# Patient Record
Sex: Male | Born: 1995
Health system: Southern US, Community
[De-identification: ages and names within clinical notes are randomized; demographics above are authoritative.]

## PROBLEM LIST (undated history)

## (undated) DIAGNOSIS — T7840XA Allergy, unspecified, initial encounter: Secondary | ICD-10-CM

## (undated) HISTORY — PX: MYRINGOTOMY: SUR874

## (undated) HISTORY — DX: Allergy, unspecified, initial encounter: T78.40XA

## (undated) HISTORY — PX: WISDOM TOOTH EXTRACTION: SHX21

---
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
## 2017-02-04 ENCOUNTER — Ambulatory Visit (INDEPENDENT_AMBULATORY_CARE_PROVIDER_SITE_OTHER): Payer: No Typology Code available for payment source | Admitting: Family Medicine

## 2017-02-04 ENCOUNTER — Encounter: Payer: Self-pay | Admitting: Family Medicine

## 2017-02-04 ENCOUNTER — Encounter (INDEPENDENT_AMBULATORY_CARE_PROVIDER_SITE_OTHER): Payer: Self-pay

## 2017-02-04 VITALS — BP 118/80 | HR 73 | Temp 97.6°F | Ht 69.25 in | Wt 230.5 lb

## 2017-02-04 DIAGNOSIS — Z7689 Persons encountering health services in other specified circumstances: Secondary | ICD-10-CM

## 2017-02-04 DIAGNOSIS — E6609 Other obesity due to excess calories: Secondary | ICD-10-CM | POA: Diagnosis not present

## 2017-02-04 DIAGNOSIS — L659 Nonscarring hair loss, unspecified: Secondary | ICD-10-CM | POA: Diagnosis not present

## 2017-02-04 DIAGNOSIS — H6123 Impacted cerumen, bilateral: Secondary | ICD-10-CM | POA: Diagnosis not present

## 2017-02-04 DIAGNOSIS — Z6833 Body mass index (BMI) 33.0-33.9, adult: Secondary | ICD-10-CM

## 2017-02-04 NOTE — Progress Notes (Signed)
Subjective:    Patient ID: Todd Noble, male    DOB: 1995-08-07, 22 y.o.   MRN: [id number]  HPI This is a 22 yo male who presents today to establish care. He lives at home with his parents and two younger brothers, works at [employer] full time and goes to [employer] at [place]. Wants to transfer to [employer] to studying acting. Likes to watch movies, hang out with family and friends.   Feels like he is not hearing as well as he should. Had tubes in both ears as child. Most of time he hears fine, just occasionally feels like he has to turn up television volume. No trouble hearing in class. Uses cotton swabs in ears after showering. Uses ear buds and listens to loud music.   Hair thinning on top. Noticed for several years since puberty. Father and maternal grandfather with thin hair.   Never been sexually active. Does not drink, use tobacco products or do drugs.   Sleeps about 7 hours per night. Feels rested. Stress manageable, better than when he was in high school. No regular exercise, is active at work and walks around campus. Trying to eat healthier. Has decreased soda intake.    Past Medical History:  Diagnosis Date  . Abdominal pain   . Allergy    Past Surgical History:  Procedure Laterality Date  . EAR CANALOPLASTY     Family History  Problem Relation Age of Onset  . Ulcers Paternal Grandmother   . Arthritis Maternal Grandmother   . Asthma Maternal Grandmother   . COPD Maternal Grandmother   . Depression Maternal Grandmother   . Diabetes Maternal Grandmother   . Hyperlipidemia Maternal Grandmother   . Hypertension Maternal Grandmother   . COPD Paternal Grandfather   . Hearing loss Paternal Grandfather    Social History   Tobacco Use  . Smoking status: Never Smoker  . Smokeless tobacco: Never Used  Substance Use Topics  . Alcohol use: No    Frequency: Never  . Drug use: No      Review of Systems  Constitutional: Positive for unexpected weight change (weight gain with  working at [employer]). Negative for fatigue and fever.  HENT: Positive for hearing loss. Negative for congestion, dental problem, ear pain and rhinorrhea.   Eyes: Negative.   Respiratory: Negative for cough and shortness of breath.   Cardiovascular: Negative for chest pain.  Gastrointestinal: Negative for abdominal pain, constipation and diarrhea.  Genitourinary: Negative for dysuria.  Musculoskeletal: Negative.   Skin:       Thinning hair  Neurological: Negative for headaches.  Psychiatric/Behavioral: Negative for dysphoric mood and sleep disturbance. The patient is not nervous/anxious.        Objective:   Physical Exam  Constitutional: He is oriented to person, place, and time. He appears well-developed and well-nourished. No distress.  Obese.   HENT:  Head: Normocephalic and atraumatic.  Right Ear: External ear normal.  Left Ear: External ear normal.  Mouth/Throat: Oropharynx is clear and moist.  Moderate amount cerumen bilaterally. No tenderness.   Eyes: Conjunctivae are normal.  Neck: Normal range of motion. Neck supple.  Cardiovascular: Normal rate, regular rhythm and normal heart sounds.  Pulmonary/Chest: Effort normal and breath sounds normal.  Lymphadenopathy:    He has no cervical adenopathy.  Neurological: He is alert and oriented to person, place, and time.  Skin: Skin is warm and dry. He is not diaphoretic.  Psychiatric: He has a normal mood and affect.  His behavior is normal. Judgment and thought content normal.  Vitals reviewed.     BP 118/80   Pulse 73   Temp 97.6 F (36.4 C) (Oral)   Ht 5' 9.25" (1.759 m)   Wt 230 lb 8 oz (104.6 kg)   SpO2 97%   BMI 33.79 kg/m   Hearing test- nml at 500, 1000, 2000, 4000    Assessment & Plan:  1. Encounter to establish care - Discussed and encouraged healthy lifestyle choices- adequate sleep, regular exercise, stress management and healthy food choices.  - will get his immunization record  -  anticipatory guidance regarding sex, alcohol, drugs, driving, STE - follow up in 1 year  2. Bilateral impacted cerumen - provided information regarding ear care and suggestions for softening cerumen  3. Class 1 obesity due to excess calories without serious comorbidity with body mass index (BMI) of 33.0 to 33.9 in adult - has been working on weight loss, encouraged him to further decrease soda intake, increase vegetable/ lean proteins   4. Thinning hair - sounds familial, can try otc minoxidil containing products   Todd Reeeborah Gianella Chismar, FNP-BC  [facility], [facility]  02/04/2017 10:17 AM

## 2017-02-04 NOTE — Patient Instructions (Signed)
Good to see you today  Please come back in one year for your annual visit  For thinning hair, can try Minoxinil containing shampoos  Your hearing is good, you do have quite a bit of wax in your ears  Can use over the counter wax softening drops or place a drop or two of mineral oil in your ears once or twice a week   Earwax Buildup, Adult The ears produce a substance called earwax that helps keep bacteria out of the ear and protects the skin in the ear canal. Occasionally, earwax can build up in the ear and cause discomfort or hearing loss. What increases the risk? This condition is more likely to develop in people who:  Are male.  Are elderly.  Naturally produce more earwax.  Clean their ears often with cotton swabs.  Use earplugs often.  Use in-ear headphones often.  Wear hearing aids.  Have narrow ear canals.  Have earwax that is overly thick or sticky.  Have eczema.  Are dehydrated.  Have excess hair in the ear canal.  What are the signs or symptoms? Symptoms of this condition include:  Reduced or muffled hearing.  A feeling of fullness in the ear or feeling that the ear is plugged.  Fluid coming from the ear.  Ear pain.  Ear itch.  Ringing in the ear.  Coughing.  An obvious piece of earwax that can be seen inside the ear canal.  How is this diagnosed? This condition may be diagnosed based on:  Your symptoms.  Your medical history.  An ear exam. During the exam, your health care provider will look into your ear with an instrument called an otoscope.  You may have tests, including a hearing test. How is this treated? This condition may be treated by:  Using ear drops to soften the earwax.  Having the earwax removed by a health care provider. The health care provider may: ? Flush the ear with water. ? Use an instrument that has a loop on the end (curette). ? Use a suction device.  Surgery to remove the wax buildup. This may be done in  severe cases.  Follow these instructions at home:  Take over-the-counter and prescription medicines only as told by your health care provider.  Do not put any objects, including cotton swabs, into your ear. You can clean the opening of your ear canal with a washcloth or facial tissue.  Follow instructions from your health care provider about cleaning your ears. Do not over-clean your ears.  Drink enough fluid to keep your urine clear or pale yellow. This will help to thin the earwax.  Keep all follow-up visits as told by your health care provider. If earwax builds up in your ears often or if you use hearing aids, consider seeing your health care provider for routine, preventive ear cleanings. Ask your health care provider how often you should schedule your cleanings.  If you have hearing aids, clean them according to instructions from the manufacturer and your health care provider. Contact a health care provider if:  You have ear pain.  You develop a fever.  You have blood, pus, or other fluid coming from your ear.  You have hearing loss.  You have ringing in your ears that does not go away.  Your symptoms do not improve with treatment.  You feel like the room is spinning (vertigo). Summary  Earwax can build up in the ear and cause discomfort or hearing loss.  The most  common symptoms of this condition include reduced or muffled hearing and a feeling of fullness in the ear or feeling that the ear is plugged.  This condition may be diagnosed based on your symptoms, your medical history, and an ear exam.  This condition may be treated by using ear drops to soften the earwax or by having the earwax removed by a health care provider.  Do not put any objects, including cotton swabs, into your ear. You can clean the opening of your ear canal with a washcloth or facial tissue. This information is not intended to replace advice given to you by your health care provider. Make sure you  discuss any questions you have with your health care provider. Document Released: 02/02/2004 Document Revised: 03/07/2016 Document Reviewed: 03/07/2016 Elsevier Interactive Patient Education  [employer]2018 [employer]

## 2018-04-30 ENCOUNTER — Telehealth: Payer: No Typology Code available for payment source | Admitting: Family

## 2018-04-30 DIAGNOSIS — W57XXXA Bitten or stung by nonvenomous insect and other nonvenomous arthropods, initial encounter: Secondary | ICD-10-CM | POA: Diagnosis not present

## 2018-04-30 DIAGNOSIS — S20369A Insect bite (nonvenomous) of unspecified front wall of thorax, initial encounter: Secondary | ICD-10-CM

## 2018-04-30 MED ORDER — DOXYCYCLINE HYCLATE 100 MG PO TABS: 100.0000 mg | ORAL_TABLET | Freq: Two times a day (BID) | ORAL | 0 refills | Status: AC

## 2018-04-30 NOTE — Progress Notes (Signed)
Thank you for describing your tick bite, Here is how we plan to help! Based on the information that you shared with me it looks like you have A tick that bite that we will treat with a short course of doxycycline.    Approximately 5 minutes was spent documenting and reviewing patient's chart.    In most cases a tick bite is painless and does not itch.  Most tick bites in which the tick is quickly removed do not require prescriptions. Ticks can transmit several diseases if they are infected and remain attacked to your skin. Therefore the length that the tick was attached and any symptoms you have experienced after the bite are import to accurately develop your custom treatment plan. In most cases a single dose of doxycycline may prevent the development of a more serious condition.  Based on your information I have Provided a home care guide for tick bites  and  instructions on when to call for help. and Your symptoms indicate that you need a longer course of antibiotics and a follow up visit with a provider. I have sent doxycycline 100 mg twice a day for 21 days to the pharmacy that you selected. You will need to schedule a follow up visit with your provider. If you do not have a primary care provider you may use our telehealth physicians on the web at [email or web address].  Which ticks  are associated with illness?  The Wood Tick (dog tick) is the size of a watermelon seed and can sometimes transmit Rocky Mountain spotted fever and [place] tick fever.   The Deer Tick (black-legged tick) is between the size of a poppy seed (pin head) and an apple seed, and can sometimes transmit Lyme disease.  A brown to black tick with a white splotch on its back is likely a male Amblyomma americanum ([place] tick). This tick has been associated with Southern Tick Associated illness ( STARI)  Lyme disease has become the most common tick-borne illness in the [place]. The risk of Lyme disease following a  recognized deer tick bite is estimated to be 1%.  The majority of cases of Lyme disease start with a bull's eye rash at the site of the tick bite. The rash can occur days to weeks (typically 7-10 days) after a tick bite. Treatment with antibiotics is indicated if this rash appears. Flu-like symptoms may accompany the rash, including: fever, chills, headaches, muscle aches, and fatigue. Removing ticks promptly may prevent tick borne disease.  What can be used to prevent Tick Bites?   Insect repellant with at leas 20% DEET.  Wearing long pants with sock and shoes.  Avoiding tall grass and heavily wooded areas.  Checking your skin after being outdoors.  Shower with a washcloth after outdoor exposures.  HOME CARE ADVICE FOR TICK BITE  1. Wood Tick Removal:  o Use a pair of tweezers and grasp the wood tick close to the skin (on its head). Pull the wood tick straight upward without twisting or crushing it. Maintain a steady pressure until it releases its grip.   o If tweezers aren't available, use fingers, a loop of thread around the jaws, or a needle between the jaws for traction.  o Note: covering the tick with petroleum jelly, nail polish or rubbing alcohol doesn't work. Neither does touching the tick with a hot or cold object. 2. Tiny Deer Tick Removal:   o Needs to be scraped off with a knife blade or credit card   edge. o Place tick in a sealed container (e.g. glass jar, zip lock plastic bag), in case your doctor wants to see it. 3. Tick's Head Removal:  o If the wood tick's head breaks off in the skin, it must be removed. Clean the skin. Then use a sterile needle to uncover the head and lift it out or scrape it off.  o If a very small piece of the head remains, the skin will eventually slough it off. 4. Antibiotic Ointment:  o Wash the wound and your hands with soap and water after removal to prevent catching any tick disease.  Apply an over the counter antibiotic ointment (e.g.  bacitracin) to the bite once. 5. Expected Course: Tick bites normally don't itch or hurt. That's why they often go unnoticed. 6. Call Your Doctor If:  o You can't remove the tick or the tick's head o Fever, a severe head ache, or rash occur in the next 2 weeks o Bite begins to look infected o Lyme's disease is common in your area o You have not had a tetanus in the last 10 years o Your current symptoms become worse    MAKE SURE YOU   Understand these instructions.  Will watch your condition.  Will get help right away if you are not doing well or get worse.   Thank you for choosing an e-visit.  Your e-visit answers were reviewed by a board certified advanced clinical practitioner to complete your personal care plan. Depending upon the condition, your plan could have included both over the counter or prescription medications. Please review your pharmacy choice. If there is a problem you may use MyChart messaging to have the prescription routed to another pharmacy. Your safety is important to us. If you have drug allergies check your prescription carefully.   You can use MyChart to ask questions about today's visit, request a non-urgent call back, or ask for a work or school excuse for 24 hours related to this e-Visit. If it has been greater than 24 hours you will need to follow up with your provider, or enter a new e-Visit to address those concerns.  You will get an email in the next two days asking about your experience. I hope  that your e-visit has been valuable and will speed your recovery  

## 2019-03-03 ENCOUNTER — Encounter: Payer: Self-pay | Admitting: Family Medicine

## 2019-03-03 NOTE — Telephone Encounter (Signed)
Pt called to schedule acute appointment for his stomach issues.  I scheduled for 03/06/19  Is it ok for pt to come in office.  He stated he only gets diarrhea after he eats certain foods.  Neg for call other covid symptoms

## 2019-03-03 NOTE — Telephone Encounter (Signed)
Pt aware he can come in office per debbie

## 2019-03-03 NOTE — Telephone Encounter (Signed)
That is fine 

## 2019-03-06 ENCOUNTER — Other Ambulatory Visit: Payer: Self-pay

## 2019-03-06 ENCOUNTER — Ambulatory Visit (INDEPENDENT_AMBULATORY_CARE_PROVIDER_SITE_OTHER): Payer: No Typology Code available for payment source | Admitting: Family Medicine

## 2019-03-06 VITALS — BP 110/74 | HR 83 | Temp 96.7°F | Ht 69.25 in | Wt 234.5 lb

## 2019-03-06 DIAGNOSIS — R197 Diarrhea, unspecified: Secondary | ICD-10-CM

## 2019-03-06 DIAGNOSIS — E6609 Other obesity due to excess calories: Secondary | ICD-10-CM | POA: Diagnosis not present

## 2019-03-06 DIAGNOSIS — Z6834 Body mass index (BMI) 34.0-34.9, adult: Secondary | ICD-10-CM

## 2019-03-06 NOTE — Patient Instructions (Signed)
Good to see you today  Let me know if diarrhea returns- can call or send a mychart message  Continue to reduce soda/ sweet tea/ juice consumption  Try to squeeze in a walk most nights

## 2019-03-06 NOTE — Progress Notes (Signed)
Subjective:    Patient ID: Todd Noble, male    DOB: 14-Feb-1995, 24 y.o.   MRN: [id number]  HPI Chief Complaint  Patient presents with  . Diarrhea    x 3 weeks. Off and on. In the mornings.   This is a 24 yo male who presents today with above cc.   Awoke about 3 weeks ago with some urgency of bowels, thought he had eaten something that didn't agree with him. Diarrhea has been coming and going. Seems worse after dinner some nights, but no consistent pattern. No diarrhea this week. Prior had 2/x week. Stool was watery at first, then would get more solid.  Denies blood or mucus. Little abdominal pain/ cramping which resolved with BM. No nausea/ vomiting. Drinking soda, water, milk, juice. No one else at home has been sick. History of IBS as a child. He denies any significant change in stressors.  He continues to work at [employer] and go to [employer].  He lives at home.   Review of Systems Per HPI    Objective:   Physical Exam Vitals reviewed.  Constitutional:      General: He is not in acute distress.    Appearance: Normal appearance. He is obese. He is not ill-appearing, toxic-appearing or diaphoretic.  HENT:     Head: Normocephalic and atraumatic.  Eyes:     Conjunctiva/sclera: Conjunctivae normal.  Cardiovascular:     Rate and Rhythm: Normal rate and regular rhythm.     Heart sounds: Normal heart sounds.  Pulmonary:     Effort: Pulmonary effort is normal.     Breath sounds: Normal breath sounds.  Abdominal:     General: Bowel sounds are normal. There is no distension.     Palpations: Abdomen is soft. There is no mass.     Tenderness: There is no abdominal tenderness. There is no guarding or rebound.     Hernia: No hernia is present.  Skin:    General: Skin is warm and dry.  Neurological:     Mental Status: He is alert and oriented to person, place, and time.  Psychiatric:        Mood and Affect: Mood normal.        Behavior: Behavior normal.        Thought Content:  Thought content normal.        Judgment: Judgment normal.       BP 110/74   Pulse 83   Temp (!) 96.7 F (35.9 C)   Ht 5' 9.25" (1.759 m) Comment: need to update on next visit  Wt 234 lb 8 oz (106.4 kg)   SpO2 97%   BMI 34.38 kg/m  Wt Readings from Last 3 Encounters:  03/06/19 234 lb 8 oz (106.4 kg)  02/04/17 230 lb 8 oz (104.6 kg)  10/23/10 153 lb (69.4 kg) (93 %, Z= 1.48)*   * Growth percentiles are based on CDC (Boys, 2-20 Years) data.       Assessment & Plan:  1. Diarrhea, unspecified type -Clear etiology, seems to have resolved as he has had no episodes this week. -Discussed importance of good hydration and can use small amounts of Imodium if recurs. -Follow-up if increased frequency, severity, fever/chills, difficulty maintaining hydration  2. Class 1 obesity due to excess calories without serious comorbidity with body mass index (BMI) of 34.0 to 34.9 in adult -Discussed his current diet and barriers to healthy food choices  -Follow-up as needed  This visit occurred  during the SARS-CoV-2 public health emergency.  Safety protocols were in place, including screening questions prior to the visit, additional usage of staff PPE, and extensive cleaning of exam room while observing appropriate contact time as indicated for disinfecting solutions.      Olean Ree, FNP-BC  [facility], [facility]  03/07/2019 12:20 PM

## 2019-03-07 ENCOUNTER — Encounter: Payer: Self-pay | Admitting: Family Medicine

## 2022-10-25 ENCOUNTER — Ambulatory Visit
Admission: EM | Admit: 2022-10-25 | Discharge: 2022-10-25 | Disposition: A | Discharge status: Home / Self Care | Payer: 59 | Attending: Family Medicine | Admitting: Family Medicine

## 2022-10-25 ENCOUNTER — Other Ambulatory Visit: Payer: Self-pay

## 2022-10-25 ENCOUNTER — Encounter: Payer: Self-pay | Admitting: Emergency Medicine

## 2022-10-25 DIAGNOSIS — J069 Acute upper respiratory infection, unspecified: Secondary | ICD-10-CM | POA: Insufficient documentation

## 2022-10-25 DIAGNOSIS — Z1152 Encounter for screening for COVID-19: Secondary | ICD-10-CM | POA: Insufficient documentation

## 2022-10-25 LAB — POCT INFLUENZA A/B
Influenza A, POC: NEGATIVE
Influenza B, POC: NEGATIVE

## 2022-10-25 NOTE — Discharge Instructions (Signed)
You were seen today for upper respiratory symptoms.  Your flu swab was negative.  The covid swab will be resulted tomorrow and you will be notified if positive.  You may also see these results in mychart.  I have given you a note for work.  Please get plenty of rest and fluids.  You may continue over the counter medications.  You may return to work when fever free and feeling improved x 24 hrs.

## 2022-10-25 NOTE — ED Provider Notes (Signed)
[facility]    CSN: [id number] Arrival date & time: 10/25/22  0855      History   Chief Complaint Chief Complaint  Patient presents with   Cough    HPI Todd Noble is a 27 y.o. male.    Cough Associated symptoms: fever   Yesterday he started with cough and fever.  He felt weak.  He had to call out to work today  He did not check his temp, but felt hot.  Also with headache.  Runny nose, congestion, drainage.    No wheezing or sob.  No sick contacts.  He did take mucinex, nyquil, dayquil, motrin.  No home covid testing.          Past Medical History:  Diagnosis Date   Abdominal pain    Allergy     Patient Active Problem List   Diagnosis Date Noted   Class 1 obesity due to excess calories without serious comorbidity with body mass index (BMI) of 33.0 to 33.9 in adult 02/04/2017   Diarrhea 07/10/2010   Generalized abdominal pain     Past Surgical History:  Procedure Laterality Date   MYRINGOTOMY         Home Medications    Prior to Admission medications   Medication Sig Start Date End Date Taking? Authorizing Provider  Fiber CHEW Chew 1 tablet by mouth daily. 07/10/10   Jon Gills, MD    Family History Family History  Problem Relation Age of Onset   Ulcers Paternal Grandmother    Arthritis Maternal Grandmother    Asthma Maternal Grandmother    COPD Maternal Grandmother    Depression Maternal Grandmother    Diabetes Maternal Grandmother    Hyperlipidemia Maternal Grandmother    Hypertension Maternal Grandmother    COPD Paternal Grandfather    Hearing loss Paternal Grandfather     Social History Social History   Tobacco Use   Smoking status: Never   Smokeless tobacco: Never  Vaping Use   Vaping status: Never Used  Substance Use Topics   Alcohol use: No   Drug use: No     Allergies   Shellfish allergy and Sulfa antibiotics   Review of Systems Review of Systems  Constitutional:  Positive for fever.  HENT:   Positive for congestion.   Respiratory:  Positive for cough.   Cardiovascular: Negative.   Gastrointestinal: Negative.   Musculoskeletal: Negative.      Physical Exam Triage Vital Signs ED Triage Vitals  Encounter Vitals Group     BP 10/25/22 0917 121/78     Systolic BP Percentile --      Diastolic BP Percentile --      Pulse Rate 10/25/22 0917 94     Resp 10/25/22 0917 18     Temp 10/25/22 0917 97.9 F (36.6 C)     Temp Source 10/25/22 0917 Oral     SpO2 10/25/22 0917 94 %     Weight 10/25/22 0919 231 lb (104.8 kg)     Height 10/25/22 0919 5\' 10"  (1.778 m)     Head Circumference --      Peak Flow --      Pain Score 10/25/22 0919 6     Pain Loc --      Pain Education --      Exclude from Growth Chart --    No data found.  Updated Vital Signs BP 121/78 (BP Location: Left Arm)   Pulse 94   Temp 97.9  F (36.6 C) (Oral)   Resp 18   Ht 5\' 10"  (1.778 m)   Wt 104.8 kg   SpO2 94%   BMI 33.15 kg/m   Visual Acuity Right Eye Distance:   Left Eye Distance:   Bilateral Distance:    Right Eye Near:   Left Eye Near:    Bilateral Near:     Physical Exam Constitutional:      General: He is not in acute distress.    Appearance: Normal appearance. He is not ill-appearing.  HENT:     Right Ear: Tympanic membrane normal.     Left Ear: Tympanic membrane normal.     Nose: Congestion present.     Mouth/Throat:     Mouth: Mucous membranes are moist.  Cardiovascular:     Rate and Rhythm: Normal rate and regular rhythm.  Pulmonary:     Effort: Pulmonary effort is normal.     Breath sounds: Normal breath sounds.  Musculoskeletal:     Cervical back: Normal range of motion and neck supple. No tenderness.  Neurological:     General: No focal deficit present.     Mental Status: He is alert.  Psychiatric:        Mood and Affect: Mood normal.      UC Treatments / Results  Labs (all labs ordered are listed, but only abnormal results are displayed) Labs Reviewed  SARS  CORONAVIRUS 2 (TAT 6-24 HRS)  POCT INFLUENZA A/B    EKG   Radiology No results found.  Procedures Procedures (including critical care time)  Medications Ordered in UC Medications - No data to display  Initial Impression / Assessment and Plan / UC Course  I have reviewed the triage vital signs and the nursing notes.  Pertinent labs & imaging results that were available during my care of the patient were reviewed by me and considered in my medical decision making (see chart for details).   Final Clinical Impressions(s) / UC Diagnoses   Final diagnoses:  Encounter for screening for COVID-19  Acute upper respiratory infection     Discharge Instructions      You were seen today for upper respiratory symptoms.  Your flu swab was negative.  The covid swab will be resulted tomorrow and you will be notified if positive.  You may also see these results in mychart.  I have given you a note for work.  Please get plenty of rest and fluids.  You may continue over the counter medications.  You may return to work when fever free and feeling improved x 24 hrs.     ED Prescriptions   None    PDMP not reviewed this encounter.   Jannifer Franklin, MD 10/25/22 [phone]

## 2022-10-25 NOTE — ED Triage Notes (Signed)
Patient c/o cough, fever, fatigue x 1 day.  Patient has taken Mucinex, Dayquil, Nyquil and Ibuprofen for sx's.

## 2022-10-26 LAB — SARS CORONAVIRUS 2 (TAT 6-24 HRS): SARS Coronavirus 2: NEGATIVE

## 2022-10-29 ENCOUNTER — Encounter: Payer: Self-pay | Admitting: Emergency Medicine

## 2022-10-29 ENCOUNTER — Ambulatory Visit: Payer: 59

## 2022-10-29 ENCOUNTER — Ambulatory Visit
Admission: EM | Admit: 2022-10-29 | Discharge: 2022-10-29 | Disposition: A | Discharge status: Home / Self Care | Payer: 59 | Attending: Physician Assistant | Admitting: Physician Assistant

## 2022-10-29 DIAGNOSIS — R059 Cough, unspecified: Secondary | ICD-10-CM

## 2022-10-29 DIAGNOSIS — J189 Pneumonia, unspecified organism: Secondary | ICD-10-CM

## 2022-10-29 DIAGNOSIS — R918 Other nonspecific abnormal finding of lung field: Secondary | ICD-10-CM | POA: Diagnosis not present

## 2022-10-29 MED ORDER — DOXYCYCLINE HYCLATE 100 MG PO CAPS: 100.0000 mg | ORAL_CAPSULE | Freq: Two times a day (BID) | ORAL | 0 refills | Status: DC

## 2022-10-29 NOTE — ED Triage Notes (Signed)
Patient c/o productive cough, states that there was blood in his sputum.  Feeling fatigue, requesting a CXR to r/o pneumonia.  Taken OTC meds Mucinex, Dayquil and Nyquil.

## 2022-10-29 NOTE — ED Provider Notes (Signed)
[facility]    CSN: [id number] Arrival date & time: 10/29/22  4098      History   Chief Complaint Chief Complaint  Patient presents with   Cough    HPI Todd Noble is a 27 y.o. male.   Patient here today for evaluation of productive cough that started a few days ago.  He reports that symptoms initially began about 4 days ago and were improving but then worsened yesterday.  He reports he has had fatigue and decreased energy levels.  He has not had fever since initial day of illness.  He would like chest x-ray if possible as he reports that he has had some blood in his sputum with coughing.  He has taken over-the-counter medication like Mucinex and DayQuil/NyQuil with mild improvement but no resolution of symptoms.  The history is provided by the patient.  Cough Associated symptoms: no chills, no ear pain, no eye discharge, no fever, no shortness of breath and no sore throat     Past Medical History:  Diagnosis Date   Abdominal pain    Allergy     Patient Active Problem List   Diagnosis Date Noted   Class 1 obesity due to excess calories without serious comorbidity with body mass index (BMI) of 33.0 to 33.9 in adult 02/04/2017   Diarrhea 07/10/2010   Generalized abdominal pain     Past Surgical History:  Procedure Laterality Date   MYRINGOTOMY         Home Medications    Prior to Admission medications   Medication Sig Start Date End Date Taking? Authorizing Provider  doxycycline (VIBRAMYCIN) 100 MG capsule Take 1 capsule (100 mg total) by mouth 2 (two) times daily. 10/29/22  Yes Tomi Bamberger, PA-C  Fiber CHEW Chew 1 tablet by mouth daily. 07/10/10  Yes Jon Gills, MD    Family History Family History  Problem Relation Age of Onset   Ulcers Paternal Grandmother    Arthritis Maternal Grandmother    Asthma Maternal Grandmother    COPD Maternal Grandmother    Depression Maternal Grandmother    Diabetes Maternal Grandmother     Hyperlipidemia Maternal Grandmother    Hypertension Maternal Grandmother    COPD Paternal Grandfather    Hearing loss Paternal Grandfather     Social History Social History   Tobacco Use   Smoking status: Never   Smokeless tobacco: Never  Vaping Use   Vaping status: Never Used  Substance Use Topics   Alcohol use: No   Drug use: No     Allergies   Shellfish allergy and Sulfa antibiotics   Review of Systems Review of Systems  Constitutional:  Negative for chills and fever.  HENT:  Positive for congestion. Negative for ear pain and sore throat.   Eyes:  Negative for discharge and redness.  Respiratory:  Positive for cough. Negative for shortness of breath.   Gastrointestinal:  Negative for abdominal pain, diarrhea, nausea and vomiting.     Physical Exam Triage Vital Signs ED Triage Vitals  Encounter Vitals Group     BP 10/29/22 0838 115/71     Systolic BP Percentile --      Diastolic BP Percentile --      Pulse Rate 10/29/22 0838 88     Resp 10/29/22 0838 18     Temp 10/29/22 0838 98.4 F (36.9 C)     Temp Source 10/29/22 0838 Oral     SpO2 10/29/22 0838 95 %  Weight --      Height --      Head Circumference --      Peak Flow --      Pain Score 10/29/22 0839 4     Pain Loc --      Pain Education --      Exclude from Growth Chart --    No data found.  Updated Vital Signs BP 115/71 (BP Location: Right Arm)   Pulse 88   Temp 98.4 F (36.9 C) (Oral)   Resp 18   SpO2 95%     Physical Exam Vitals and nursing note reviewed.  Constitutional:      General: He is not in acute distress.    Appearance: Normal appearance. He is not ill-appearing.  HENT:     Head: Normocephalic and atraumatic.     Right Ear: Tympanic membrane normal.     Left Ear: Tympanic membrane normal.     Nose: Nose normal. No congestion.     Mouth/Throat:     Mouth: Mucous membranes are moist.     Pharynx: Oropharynx is clear. No oropharyngeal exudate or posterior oropharyngeal  erythema.  Eyes:     Conjunctiva/sclera: Conjunctivae normal.  Cardiovascular:     Rate and Rhythm: Normal rate.  Pulmonary:     Effort: Pulmonary effort is normal. No respiratory distress.  Skin:    General: Skin is warm and dry.  Neurological:     Mental Status: He is alert.  Psychiatric:        Mood and Affect: Mood normal.        Thought Content: Thought content normal.      UC Treatments / Results  Labs (all labs ordered are listed, but only abnormal results are displayed) Labs Reviewed - No data to display  EKG   Radiology DG Chest 2 View  Result Date: 10/29/2022 CLINICAL DATA:  Cough. EXAM: CHEST - 2 VIEW COMPARISON:  August 11, 2008. FINDINGS: The heart size and mediastinal contours are within normal limits. Right lung is clear. Left lower lobe airspace opacity is noted concerning for possible pneumonia. The visualized skeletal structures are unremarkable. IMPRESSION: Left lower lobe opacity is noted concerning for pneumonia. Followup PA and lateral chest X-ray is recommended in 3-4 weeks following trial of antibiotic therapy to ensure resolution and exclude underlying malignancy. Electronically Signed   By: Lupita Raider M.D.   On: 10/29/2022 10:58    Procedures Procedures (including critical care time)  Medications Ordered in UC Medications - No data to display  Initial Impression / Assessment and Plan / UC Course  I have reviewed the triage vital signs and the nursing notes.  Pertinent labs & imaging results that were available during my care of the patient were reviewed by me and considered in my medical decision making (see chart for details).    Doxycycline prescribed to treat possible pneumonia.  Encouraged follow-up with any further concerns or worsening symptoms.  Recommend repeat chest x-ray in 3 to 4 weeks for clearance as recommended by radiology.  Final Clinical Impressions(s) / UC Diagnoses   Final diagnoses:  Pneumonia of left lower lobe due to  infectious organism   Discharge Instructions   None    ED Prescriptions     Medication Sig Dispense Auth. Provider   doxycycline (VIBRAMYCIN) 100 MG capsule Take 1 capsule (100 mg total) by mouth 2 (two) times daily. 20 capsule Tomi Bamberger, PA-C      PDMP not reviewed this  encounter.   Tomi Bamberger, PA-C 10/29/22 1350

## 2023-03-25 ENCOUNTER — Ambulatory Visit (HOSPITAL_BASED_OUTPATIENT_CLINIC_OR_DEPARTMENT_OTHER)
Admission: RE | Admit: 2023-03-25 | Discharge: 2023-03-25 | Disposition: A | Discharge status: Home / Self Care | Payer: Self-pay | Source: Ambulatory Visit

## 2023-03-25 ENCOUNTER — Encounter (HOSPITAL_BASED_OUTPATIENT_CLINIC_OR_DEPARTMENT_OTHER): Payer: Self-pay

## 2023-03-25 ENCOUNTER — Telehealth (HOSPITAL_BASED_OUTPATIENT_CLINIC_OR_DEPARTMENT_OTHER): Payer: Self-pay | Admitting: Family Medicine

## 2023-03-25 VITALS — BP 119/79 | HR 93 | Temp 98.4°F | Resp 18

## 2023-03-25 DIAGNOSIS — J029 Acute pharyngitis, unspecified: Secondary | ICD-10-CM

## 2023-03-25 DIAGNOSIS — J020 Streptococcal pharyngitis: Secondary | ICD-10-CM

## 2023-03-25 DIAGNOSIS — H6123 Impacted cerumen, bilateral: Secondary | ICD-10-CM

## 2023-03-25 LAB — POCT RAPID STREP A (OFFICE): Rapid Strep A Screen: POSITIVE — AB

## 2023-03-25 MED ORDER — AMOXICILLIN 875 MG PO TABS: 875.0000 mg | ORAL_TABLET | Freq: Two times a day (BID) | ORAL | 0 refills | Status: AC

## 2023-03-25 NOTE — ED Triage Notes (Signed)
 Pt reports sore throat started yesterday, sinus congestion and did vomit this morning.

## 2023-03-25 NOTE — Discharge Instructions (Addendum)
 Rapid strep is positive.  Amoxicillin 875 mg, twice daily for 7 days.  Get plenty of fluids and rest.  Work excuse provided.  Follow-up if symptoms do not improve, worsen or new symptoms occur.  Incidental Finding on the Exam:  Bilateral Ear Wax blockages. There is hard wax in your ear(s).  It should be softened before someone tries to rinse out the ears.  Use Colace gelcaps (an over-the-counter stool softener).  Use a large needle or a safety pin to poke a hole in 1 part of the gelcap.  Squeeze 1 or 2 gelcaps into an ear canal and then put a cottonball in the ear.  Do this to both ears if both ears have wax.  Do this at night prior to sleep (for 2-4 nights) and just prior to a planned visit for ear cleaning.  This will soften the wax so that it is easy to clean your ears out.   Return for ear lavage here or to his PCP, once he is feeling better.

## 2023-03-25 NOTE — Telephone Encounter (Signed)
 Incidental Finding on the Exam:  Bilateral Ear Wax blockages. There is hard wax in your ear(s).  It should be softened before someone tries to rinse out the ears.  Use Colace gelcaps (an over-the-counter stool softener).  Use a large needle or a safety pin to poke a hole in 1 part of the gelcap.  Squeeze 1 or 2 gelcaps into an ear canal and then put a cottonball in the ear.  Do this to both ears if both ears have wax.  Do this at night prior to sleep (for 2-4 nights) and just prior to a planned visit for ear cleaning.  This will soften the wax so that it is easy to clean your ears out.   Return for ear lavage here or to his PCP, once he is feeling better.  I was able to reach the patient and update him on the above information.

## 2023-03-25 NOTE — ED Provider Notes (Addendum)
 [facility]    CSN: [id number] Arrival date & time: 03/25/23  0848      History   Chief Complaint Chief Complaint  Patient presents with   Sore Throat    Throat is really red and sore. - Entered by patient    HPI Todd Noble is a 28 y.o. male.   Patient reports that he started with a sore throat yesterday, 03/24/2023.  Its gotten worse during the night.  He vomited once yesterday but it was just like he started coughing and vomited.  He has not had any nausea.  He feels like he has had a fever but has not taken his temperature.  His throat pain is much worse today.  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   Sore Throat Pertinent negatives include no chest pain and no abdominal pain.    Past Medical History:  Diagnosis Date   Abdominal pain    Allergy     Patient Active Problem List   Diagnosis Date Noted   Class 1 obesity due to excess calories without serious comorbidity with body mass index (BMI) of 33.0 to 33.9 in adult 02/04/2017   Diarrhea 07/10/2010   Generalized abdominal pain     Past Surgical History:  Procedure Laterality Date   MYRINGOTOMY         Home Medications    Prior to Admission medications   Medication Sig Start Date End Date Taking? Authorizing Provider  amoxicillin (AMOXIL) 875 MG tablet Take 1 tablet (875 mg total) by mouth 2 (two) times daily for 7 days. Take after food or with a meal. 03/25/23 04/01/23 Yes Prescilla Sours, FNP  fexofenadine (ALLEGRA) 60 MG tablet Take 60 mg by mouth daily.   Yes [provider]  Multiple Vitamin (MULTIVITAMIN) tablet Take 1 tablet by mouth daily.   Yes [provider]  Fiber CHEW Chew 1 tablet by mouth daily. 07/10/10   Jon Gills, MD    Family History Family History  Problem Relation Age of Onset   Ulcers Paternal Grandmother    Arthritis Maternal Grandmother    Asthma Maternal Grandmother    COPD Maternal Grandmother    Depression  Maternal Grandmother    Diabetes Maternal Grandmother    Hyperlipidemia Maternal Grandmother    Hypertension Maternal Grandmother    COPD Paternal Grandfather    Hearing loss Paternal Grandfather     Social History Social History   Tobacco Use   Smoking status: Never   Smokeless tobacco: Never  Vaping Use   Vaping status: Never Used  Substance Use Topics   Alcohol use: No   Drug use: No     Allergies   Shellfish allergy and Sulfa antibiotics   Review of Systems Review of Systems  Constitutional:  Positive for fever. Negative for chills.  HENT:  Positive for sore throat. Negative for ear pain.   Eyes:  Negative for pain and visual disturbance.  Respiratory:  Negative for cough.   Cardiovascular:  Negative for chest pain and palpitations.  Gastrointestinal:  Positive for vomiting. Negative for abdominal pain, constipation, diarrhea and nausea.  Genitourinary:  Negative for dysuria and hematuria.  Musculoskeletal:  Negative for arthralgias and back pain.  Skin:  Negative for color change and rash.  Neurological:  Negative for seizures and syncope.  All other systems reviewed and are negative.    Physical Exam Triage Vital Signs ED Triage Vitals  Encounter Vitals Group  BP 03/25/23 0857 119/79     Systolic BP Percentile --      Diastolic BP Percentile --      Pulse Rate 03/25/23 0857 93     Resp 03/25/23 0857 18     Temp 03/25/23 0857 98.4 F (36.9 C)     Temp Source 03/25/23 0857 Oral     SpO2 03/25/23 0857 97 %     Weight --      Height --      Head Circumference --      Peak Flow --      Pain Score 03/25/23 0855 5     Pain Loc --      Pain Education --      Exclude from Growth Chart --    No data found.  Updated Vital Signs BP 119/79 (BP Location: Right Arm)   Pulse 93   Temp 98.4 F (36.9 C) (Oral)   Resp 18   SpO2 97%   Visual Acuity Right Eye Distance:   Left Eye Distance:   Bilateral Distance:    Right Eye Near:   Left Eye Near:     Bilateral Near:     Physical Exam Vitals and nursing note reviewed.  Constitutional:      General: He is not in acute distress.    Appearance: He is well-developed. He is not ill-appearing or toxic-appearing.  HENT:     Head: Normocephalic and atraumatic.     Right Ear: Hearing and external ear normal. There is impacted cerumen (completely obstructed).     Left Ear: Hearing and external ear normal. There is impacted cerumen (completely obstructed).     Nose: No congestion or rhinorrhea.     Right Sinus: No maxillary sinus tenderness or frontal sinus tenderness.     Left Sinus: No maxillary sinus tenderness or frontal sinus tenderness.     Mouth/Throat:     Lips: Pink.     Mouth: Mucous membranes are moist.     Pharynx: Uvula midline. Posterior oropharyngeal erythema present. No oropharyngeal exudate.     Tonsils: No tonsillar exudate (Tonsils are erythematous and enlarged but no exudate.).  Eyes:     Conjunctiva/sclera: Conjunctivae normal.     Pupils: Pupils are equal, round, and reactive to light.  Cardiovascular:     Rate and Rhythm: Normal rate and regular rhythm.     Heart sounds: S1 normal and S2 normal. No murmur heard. Pulmonary:     Effort: Pulmonary effort is normal. No respiratory distress.     Breath sounds: Normal breath sounds. No decreased breath sounds, wheezing, rhonchi or rales.  Abdominal:     General: Bowel sounds are normal.     Palpations: Abdomen is soft.     Tenderness: There is no abdominal tenderness.  Musculoskeletal:        General: No swelling.     Cervical back: Neck supple.  Lymphadenopathy:     Head:     Right side of head: Tonsillar adenopathy present. No submental, submandibular, preauricular or posterior auricular adenopathy.     Left side of head: Tonsillar adenopathy present. No submental, submandibular, preauricular or posterior auricular adenopathy.     Cervical: Cervical adenopathy present.     Right cervical: Superficial cervical  adenopathy present.     Left cervical: Superficial cervical adenopathy present.  Skin:    General: Skin is warm and dry.     Capillary Refill: Capillary refill takes less than 2 seconds.  Findings: No rash.  Neurological:     Mental Status: He is alert and oriented to person, place, and time.  Psychiatric:        Mood and Affect: Mood normal.      UC Treatments / Results  Labs (all labs ordered are listed, but only abnormal results are displayed) Labs Reviewed  POCT RAPID STREP A (OFFICE) - Abnormal; Notable for the following components:      Result Value   Rapid Strep A Screen Positive (*)    All other components within normal limits    EKG   Radiology No results found.  Procedures Procedures (including critical care time)  Medications Ordered in UC Medications - No data to display  Initial Impression / Assessment and Plan / UC Course  I have reviewed the triage vital signs and the nursing notes.  Pertinent labs & imaging results that were available during my care of the patient were reviewed by me and considered in my medical decision making (see chart for details).     Positive for strep.  Amoxicillin 875 mg, twice daily for 7 days.  Get plenty of fluids and rest.  Work excuse provided.  Follow-up if symptoms do not improve, worsen or new symptoms occur. Final Clinical Impressions(s) / UC Diagnoses   Final diagnoses:  Sore throat  Streptococcal sore throat  Bilateral impacted cerumen     Discharge Instructions      Rapid strep is positive.  Amoxicillin 875 mg, twice daily for 7 days.  Get plenty of fluids and rest.  Work excuse provided.  Follow-up if symptoms do not improve, worsen or new symptoms occur.  Incidental Finding on the Exam:  Bilateral Ear Wax blockages. There is hard wax in your ear(s).  It should be softened before someone tries to rinse out the ears.  Use Colace gelcaps (an over-the-counter stool softener).  Use a large needle or a  safety pin to poke a hole in 1 part of the gelcap.  Squeeze 1 or 2 gelcaps into an ear canal and then put a cottonball in the ear.  Do this to both ears if both ears have wax.  Do this at night prior to sleep (for 2-4 nights) and just prior to a planned visit for ear cleaning.  This will soften the wax so that it is easy to clean your ears out.   Return for ear lavage here or to his PCP, once he is feeling better.     ED Prescriptions     Medication Sig Dispense Auth. Provider   amoxicillin (AMOXIL) 875 MG tablet Take 1 tablet (875 mg total) by mouth 2 (two) times daily for 7 days. Take after food or with a meal. 14 tablet Prescilla Sours, FNP      PDMP not reviewed this encounter.   Prescilla Sours, FNP 03/25/23 0940    Prescilla Sours, FNP 03/25/23 [phone]

## 2023-07-26 ENCOUNTER — Encounter: Payer: Self-pay | Admitting: Advanced Practice Midwife

## 2023-09-25 ENCOUNTER — Ambulatory Visit
Admission: EM | Admit: 2023-09-25 | Discharge: 2023-09-25 | Disposition: A | Discharge status: Home / Self Care | Attending: Family Medicine | Admitting: Family Medicine

## 2023-09-25 ENCOUNTER — Ambulatory Visit (INDEPENDENT_AMBULATORY_CARE_PROVIDER_SITE_OTHER)

## 2023-09-25 DIAGNOSIS — R079 Chest pain, unspecified: Secondary | ICD-10-CM

## 2023-09-25 DIAGNOSIS — R42 Dizziness and giddiness: Secondary | ICD-10-CM | POA: Diagnosis not present

## 2023-09-25 LAB — GLUCOSE, POCT (MANUAL RESULT ENTRY): POCT Glucose (KUC): 98 mg/dL (ref 70–99)

## 2023-09-25 LAB — POC COVID19/FLU A&B COMBO
Covid Antigen, POC: NEGATIVE
Influenza A Antigen, POC: NEGATIVE
Influenza B Antigen, POC: NEGATIVE

## 2023-09-25 MED ORDER — LIDOCAINE VISCOUS HCL 2 % MT SOLN
15.0000 mL | Freq: Once | OROMUCOSAL | Status: AC
  Administered 2023-09-25: 15 mL via OROMUCOSAL

## 2023-09-25 MED ORDER — ALUM & MAG HYDROXIDE-SIMETH 200-200-20 MG/5ML PO SUSP
30.0000 mL | Freq: Once | ORAL | Status: AC
  Administered 2023-09-25: 30 mL via ORAL

## 2023-09-25 NOTE — ED Provider Notes (Signed)
 [facility]   [id number] 09/25/23 Arrival Time: 1229  ASSESSMENT & PLAN:  1. Lightheadedness   2. Chest pain, unspecified type    Unclear etiology. Ques heat related given description. Feeling a little better now. Patient history and exam consistent with non-cardiac cause of chest pain.  ECG: Performed today and interpreted by me: normal EKG, normal sinus rhythm. No STEMI.  I have personally viewed the imaging studies ordered this visit. CXR: no acute changes; no pneumothorax.   Meds ordered this encounter  Medications   lidocaine  (XYLOCAINE ) 2 % viscous mouth solution 15 mL   alum & mag hydroxide-simeth (MAALOX/MYLANTA) 200-200-20 MG/5ML suspension 30 mL  Unsure if above has helped at all.    Discharge Instructions      You have been seen at the [facility] today for chest pain. Your evaluation today was not suggestive of any emergent condition requiring medical intervention at this time. Your chest x-ray and ECG (heart tracing) did not show any worrisome changes. However, some medical problems make take more time to appear. Therefore, it's very important that you pay attention to any new symptoms or worsening of your current condition.  Please proceed directly to the Emergency Department immediately should you feel worse in any way or have any of the following symptoms: increasing or different chest pain, pain that spreads to your arm, neck, jaw, back or abdomen, shortness of breath, and/or nausea and vomiting.      Reviewed expectations re: course of current medical issues. Questions answered. Outlined signs and symptoms indicating need for more acute intervention. Patient verbalized understanding. After Visit Summary given.   SUBJECTIVE:  History from: patient. Todd Noble is a 28 y.o. 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 Felt sweaty and lightheaded while in  kitchen. Mild tingling in hands. Did not pass out. Also notes L upper chest pain since episode. Overall feeling a little better here. Denies assoc n/v. Was sweating but does at work normally. Denies SOB. Denies smoking/recreational drug use/alcohol use. Has been burping more than usual. No tx PTA. Denies fever/recent cold symptoms. Did eat earlier.  Social History   Tobacco Use  Smoking Status Never  Smokeless Tobacco Never   Social History   Substance and Sexual Activity  Alcohol Use No    OBJECTIVE:  Vitals:   09/25/23 1243 09/25/23 1244  BP:  130/89  Pulse:  100  Resp:  18  Temp:  97.9 F (36.6 C)  TempSrc:  Oral  SpO2:  98%  Weight: 108.9 kg   Height: 5' 10 (1.778 m)     General appearance: alert, oriented, no acute distress; does appear fatigued Eyes: PERRLA; EOMI; conjunctivae normal HENT: normocephalic; atraumatic Neck: supple with FROM Lungs: without labored respirations; speaks full sentences without difficulty; CTAB Heart: regular rate and rhythm without murmer Chest Wall: mild soreness over L upper chest wall Abdomen: soft, non-tender; no guarding or rebound tenderness Extremities: without edema; without calf swelling or tenderness; symmetrical without gross deformities Skin: warm and dry; without rash or lesions Neuro: normal gait Psychological: alert and cooperative; normal mood and affect  Labs: Results for orders placed or performed during the hospital encounter of 09/25/23  POC Covid19/Flu A&B Antigen   Collection Time: 09/25/23  2:14 PM  Result Value Ref Range   Influenza A Antigen, POC Negative Negative   Influenza B Antigen, POC Negative Negative   Covid Antigen, POC Negative  Negative  POCT CBG (manual entry)   Collection Time: 09/25/23  2:15 PM  Result Value Ref Range   POCT Glucose ([facility]) 98 70 - 99 mg/dL   Labs Reviewed  GLUCOSE, POCT (MANUAL RESULT ENTRY) - Normal  POC COVID19/FLU A&B COMBO - Normal    Imaging: No results found.    Allergies  Allergen Reactions   Shellfish Allergy    Sulfa Antibiotics     Past Medical History:  Diagnosis Date   Abdominal pain    Allergy    Social History   Socioeconomic History   Marital status: Single    Spouse name: Not on file   Number of children: Not on file   Years of education: Not on file   Highest education level: Not on file  Occupational History   Not on file  Tobacco Use   Smoking status: Never   Smokeless tobacco: Never  Vaping Use   Vaping status: Never Used  Substance and Sexual Activity   Alcohol use: No   Drug use: No   Sexual activity: Not Currently  Other Topics Concern   Not on file  Social History Narrative   Completed 7th grade   Social Drivers of Health   Financial Resource Strain: Not on file  Food Insecurity: Not on file  Transportation Needs: Not on file  Physical Activity: Not on file  Stress: Not on file  Social Connections: Not on file  Intimate Partner Violence: Not on file   Family History  Problem Relation Age of Onset   Ulcers Paternal Grandmother    Arthritis Maternal Grandmother    Asthma Maternal Grandmother    COPD Maternal Grandmother    Depression Maternal Grandmother    Diabetes Maternal Grandmother    Hyperlipidemia Maternal Grandmother    Hypertension Maternal Grandmother    COPD Paternal Grandfather    Hearing loss Paternal Grandfather    Past Surgical History:  Procedure Laterality Date   MYRINGOTOMY        Rolinda Rogue, MD 09/25/23 1600

## 2023-09-25 NOTE — ED Triage Notes (Signed)
 Patient reports about ago at work felt hot (more than normal), then dizziness, light-headed and some numbness/tingling in hands. I also had some left shoulder pain this morning prior to this even and the hand on my left felt differently than right. No chest pain (once described). No sob. No nausea or vomiting. Currently has/followed by PCP.

## 2023-09-25 NOTE — Discharge Instructions (Signed)
You have been seen at the [facility] today for chest pain. Your evaluation today was not suggestive of any emergent condition requiring medical intervention at this time. Your chest x-ray and ECG (heart tracing) did not show any worrisome changes. However, some medical problems make take more time to appear. Therefore, it's very important that you pay attention to any new symptoms or worsening of your current condition.  Please proceed directly to the Emergency Department immediately should you feel worse in any way or have any of the following symptoms: increasing or different chest pain, pain that spreads to your arm, neck, jaw, back or abdomen, shortness of breath, and/or nausea and vomiting.

## 2024-01-09 ENCOUNTER — Ambulatory Visit: Admission: EM | Admit: 2024-01-09 | Discharge: 2024-01-09 | Disposition: A | Discharge status: Home / Self Care

## 2024-01-09 ENCOUNTER — Encounter: Payer: Self-pay | Admitting: Emergency Medicine

## 2024-01-09 DIAGNOSIS — J069 Acute upper respiratory infection, unspecified: Secondary | ICD-10-CM

## 2024-01-09 LAB — POCT INFLUENZA A/B
Influenza A, POC: NEGATIVE
Influenza B, POC: NEGATIVE

## 2024-01-09 NOTE — ED Triage Notes (Signed)
 Pt presents c/o URI x 2 days. Pt states,  Well, I had like a cough earlier this week but then yesterday it got worse. I started feeling really fatigued, I kept getting the chills, and the cough got worse. I need to make sure I don't have the flu.  Pt denies emesis and diarrhea.

## 2024-01-09 NOTE — Discharge Instructions (Signed)

## 2024-01-09 NOTE — ED Provider Notes (Signed)
 " [facility]    CSN: [id number] Arrival date & time: 01/09/24  0920      History   Chief Complaint Chief Complaint  Patient presents with   URI    HPI Todd Noble is a 29 y.o. male.   Pt presents today due to 2 days of cough, subjective fever, body aches, chills, and reduced appetite. Pt states that he has been using OTC meds for symptoms.   The history is provided by the patient.  URI   Past Medical History:  Diagnosis Date   Abdominal pain    Allergy     Patient Active Problem List   Diagnosis Date Noted   Class 1 obesity due to excess calories without serious comorbidity with body mass index (BMI) of 33.0 to 33.9 in adult 02/04/2017   Diarrhea 07/10/2010   Generalized abdominal pain     Past Surgical History:  Procedure Laterality Date   MYRINGOTOMY     WISDOM TOOTH EXTRACTION Bilateral        Home Medications    Prior to Admission medications  Medication Sig Start Date End Date Taking? Authorizing Provider  ibuprofen  (ADVIL ) 200 MG tablet Take 200 mg by mouth every 6 (six) hours as needed.   Yes [provider]  fexofenadine (ALLEGRA) 60 MG tablet Take 60 mg by mouth daily.    [provider]  Fiber CHEW Chew 1 tablet by mouth daily. 07/10/10   Gretta Fairy DEL, MD  Multiple Vitamin (MULTIVITAMIN) tablet Take 1 tablet by mouth daily.    [provider]    Family History Family History  Problem Relation Age of Onset   Ulcers Paternal Grandmother    Arthritis Maternal Grandmother    Asthma Maternal Grandmother    COPD Maternal Grandmother    Depression Maternal Grandmother    Diabetes Maternal Grandmother    Hyperlipidemia Maternal Grandmother    Hypertension Maternal Grandmother    COPD Paternal Grandfather    Hearing loss Paternal Grandfather     Social History Social History[1]   Allergies   Shellfish allergy and Sulfa antibiotics   Review of Systems Review of Systems   Physical Exam Triage  Vital Signs ED Triage Vitals  Encounter Vitals Group     BP 01/09/24 1042 131/87     Girls Systolic BP Percentile --      Girls Diastolic BP Percentile --      Boys Systolic BP Percentile --      Boys Diastolic BP Percentile --      Pulse Rate 01/09/24 1042 99     Resp 01/09/24 1042 20     Temp 01/09/24 1042 98.5 F (36.9 C)     Temp Source 01/09/24 1042 Oral     SpO2 01/09/24 1042 98 %     Weight 01/09/24 1041 240 lb 1.3 oz (108.9 kg)     Height --      Head Circumference --      Peak Flow --      Pain Score 01/09/24 1040 3     Pain Loc --      Pain Education --      Exclude from Growth Chart --    No data found.  Updated Vital Signs BP 131/87 (BP Location: Left Arm)   Pulse 99   Temp 98.5 F (36.9 C) (Oral)   Resp 20   Wt 240 lb 1.3 oz (108.9 kg)   SpO2 98%   BMI 34.45 kg/m  Visual Acuity Right Eye Distance:   Left Eye Distance:   Bilateral Distance:    Right Eye Near:   Left Eye Near:    Bilateral Near:     Physical Exam Vitals and nursing note reviewed.  Constitutional:      General: He is not in acute distress.    Appearance: Normal appearance. He is not ill-appearing, toxic-appearing or diaphoretic.  HENT:     Nose: Congestion (moderately enlarged turbinates) present. No rhinorrhea.     Mouth/Throat:     Mouth: Mucous membranes are moist.     Pharynx: Oropharynx is clear. No oropharyngeal exudate or posterior oropharyngeal erythema.  Eyes:     General: No scleral icterus. Cardiovascular:     Rate and Rhythm: Normal rate and regular rhythm.     Heart sounds: Normal heart sounds.  Pulmonary:     Effort: Pulmonary effort is normal. No respiratory distress.     Breath sounds: Normal breath sounds. No wheezing or rhonchi.  Skin:    General: Skin is warm.  Neurological:     Mental Status: He is alert and oriented to person, place, and time.  Psychiatric:        Mood and Affect: Mood normal.        Behavior: Behavior normal.      UC Treatments  / Results  Labs (all labs ordered are listed, but only abnormal results are displayed) Labs Reviewed  POCT INFLUENZA A/B    EKG   Radiology No results found.  Procedures Procedures (including critical care time)  Medications Ordered in UC Medications - No data to display  Initial Impression / Assessment and Plan / UC Course  I have reviewed the triage vital signs and the nursing notes.  Pertinent labs & imaging results that were available during my care of the patient were reviewed by me and considered in my medical decision making (see chart for details).    Final Clinical Impressions(s) / UC Diagnoses   Final diagnoses:  Viral URI   Discharge Instructions   None    ED Prescriptions   None    PDMP not reviewed this encounter.    [1]  Social History Tobacco Use   Smoking status: Never    Passive exposure: Never   Smokeless tobacco: Never  Vaping Use   Vaping status: Never Used  Substance Use Topics   Alcohol use: No   Drug use: No     Andra Corean BROCKS, PA-C 01/09/24 1109  "
